# Patient Record
Sex: Male | Born: 1965 | Race: White | Hispanic: Yes | Marital: Married | Smoking: Former smoker
Health system: Southern US, Community
[De-identification: ages and names within clinical notes are randomized; demographics above are authoritative.]

## PROBLEM LIST (undated history)

## (undated) DIAGNOSIS — J45909 Unspecified asthma, uncomplicated: Secondary | ICD-10-CM

## (undated) DIAGNOSIS — F419 Anxiety disorder, unspecified: Secondary | ICD-10-CM

## (undated) DIAGNOSIS — S82851A Displaced trimalleolar fracture of right lower leg, initial encounter for closed fracture: Secondary | ICD-10-CM

## (undated) HISTORY — PX: OTHER SURGICAL HISTORY: SHX169

## (undated) HISTORY — PX: EYE SURGERY: SHX253

---

## 2015-10-13 ENCOUNTER — Emergency Department (HOSPITAL_COMMUNITY): Payer: BLUE CROSS/BLUE SHIELD

## 2015-10-13 ENCOUNTER — Encounter (HOSPITAL_COMMUNITY): Payer: Self-pay | Admitting: Emergency Medicine

## 2015-10-13 ENCOUNTER — Emergency Department (HOSPITAL_COMMUNITY)
Admission: EM | Admit: 2015-10-13 | Discharge: 2015-10-13 | Disposition: A | Discharge status: Home / Self Care | Payer: BLUE CROSS/BLUE SHIELD | Attending: Emergency Medicine | Admitting: Emergency Medicine

## 2015-10-13 DIAGNOSIS — Z87891 Personal history of nicotine dependence: Secondary | ICD-10-CM | POA: Diagnosis not present

## 2015-10-13 DIAGNOSIS — Z9889 Other specified postprocedural states: Secondary | ICD-10-CM | POA: Insufficient documentation

## 2015-10-13 DIAGNOSIS — Y998 Other external cause status: Secondary | ICD-10-CM | POA: Diagnosis not present

## 2015-10-13 DIAGNOSIS — S82851A Displaced trimalleolar fracture of right lower leg, initial encounter for closed fracture: Secondary | ICD-10-CM | POA: Diagnosis not present

## 2015-10-13 DIAGNOSIS — Y9355 Activity, bike riding: Secondary | ICD-10-CM | POA: Diagnosis not present

## 2015-10-13 DIAGNOSIS — R52 Pain, unspecified: Secondary | ICD-10-CM

## 2015-10-13 DIAGNOSIS — Y92410 Unspecified street and highway as the place of occurrence of the external cause: Secondary | ICD-10-CM | POA: Diagnosis not present

## 2015-10-13 DIAGNOSIS — Z79899 Other long term (current) drug therapy: Secondary | ICD-10-CM | POA: Diagnosis not present

## 2015-10-13 DIAGNOSIS — T148XXA Other injury of unspecified body region, initial encounter: Secondary | ICD-10-CM

## 2015-10-13 DIAGNOSIS — S99911A Unspecified injury of right ankle, initial encounter: Secondary | ICD-10-CM | POA: Diagnosis present

## 2015-10-13 LAB — CBC WITH DIFFERENTIAL/PLATELET
BASOS PCT: 0 %
Basophils Absolute: 0 10*3/uL (ref 0.0–0.1)
EOS ABS: 0.1 10*3/uL (ref 0.0–0.7)
Eosinophils Relative: 1 %
HCT: 43.5 % (ref 39.0–52.0)
HEMOGLOBIN: 14.5 g/dL (ref 13.0–17.0)
Lymphocytes Relative: 13 %
Lymphs Abs: 1 10*3/uL (ref 0.7–4.0)
MCH: 30 pg (ref 26.0–34.0)
MCHC: 33.3 g/dL (ref 30.0–36.0)
MCV: 90.1 fL (ref 78.0–100.0)
Monocytes Absolute: 0.5 10*3/uL (ref 0.1–1.0)
Monocytes Relative: 6 %
NEUTROS PCT: 80 %
Neutro Abs: 5.8 10*3/uL (ref 1.7–7.7)
PLATELETS: 176 10*3/uL (ref 150–400)
RBC: 4.83 MIL/uL (ref 4.22–5.81)
RDW: 13.1 % (ref 11.5–15.5)
WBC: 7.3 10*3/uL (ref 4.0–10.5)

## 2015-10-13 LAB — COMPREHENSIVE METABOLIC PANEL
ALBUMIN: 3.9 g/dL (ref 3.5–5.0)
ALK PHOS: 73 U/L (ref 38–126)
ALT: 42 U/L (ref 17–63)
ANION GAP: 4 — AB (ref 5–15)
AST: 38 U/L (ref 15–41)
BUN: 14 mg/dL (ref 6–20)
CHLORIDE: 106 mmol/L (ref 101–111)
CO2: 27 mmol/L (ref 22–32)
Calcium: 8.7 mg/dL — ABNORMAL LOW (ref 8.9–10.3)
Creatinine, Ser: 1.18 mg/dL (ref 0.61–1.24)
GFR calc Af Amer: >60 mL/min (ref >60)
GFR calc non Af Amer: >60 mL/min (ref >60)
GLUCOSE: 109 mg/dL — AB (ref 65–99)
POTASSIUM: 4.6 mmol/L (ref 3.5–5.1)
SODIUM: 137 mmol/L (ref 135–145)
Total Bilirubin: 0.9 mg/dL (ref 0.3–1.2)
Total Protein: 6.1 g/dL — ABNORMAL LOW (ref 6.5–8.1)

## 2015-10-13 LAB — ETHANOL: Alcohol, Ethyl (B): <5 mg/dL (ref <5)

## 2015-10-13 LAB — SAMPLE TO BLOOD BANK

## 2015-10-13 LAB — PROTIME-INR
INR: 1.02 (ref 0.00–1.49)
Prothrombin Time: 13.6 seconds (ref 11.6–15.2)

## 2015-10-13 LAB — CDS SEROLOGY

## 2015-10-13 MED ORDER — HYDROMORPHONE HCL 1 MG/ML IJ SOLN
1.0000 mg | Freq: Once | INTRAMUSCULAR | Status: AC
  Administered 2015-10-13: 1 mg via INTRAVENOUS
  Filled 2015-10-13: qty 1

## 2015-10-13 MED ORDER — KETAMINE HCL 10 MG/ML IJ SOLN
1.0000 mg/kg | Freq: Once | INTRAMUSCULAR | Status: DC
Start: 2015-10-13 — End: 2015-10-13

## 2015-10-13 MED ORDER — OXYCODONE-ACETAMINOPHEN 5-325 MG PO TABS: 2.0000 | ORAL_TABLET | ORAL | Status: DC | PRN

## 2015-10-13 MED ORDER — ONDANSETRON HCL 4 MG/2ML IJ SOLN: 4.0000 mg | Freq: Once | INTRAMUSCULAR | Status: DC

## 2015-10-13 MED ORDER — METOCLOPRAMIDE HCL 5 MG/ML IJ SOLN
10.0000 mg | Freq: Once | INTRAMUSCULAR | Status: AC
  Administered 2015-10-13: 10 mg via INTRAVENOUS
  Filled 2015-10-13: qty 2

## 2015-10-13 MED ORDER — PROMETHAZINE HCL 25 MG/ML IJ SOLN
25.0000 mg | Freq: Once | INTRAMUSCULAR | Status: AC
  Administered 2015-10-13: 25 mg via INTRAVENOUS
  Filled 2015-10-13: qty 1

## 2015-10-13 MED ORDER — PROPOFOL 10 MG/ML IV BOLUS
65.0000 mg | Freq: Once | INTRAVENOUS | Status: AC
  Administered 2015-10-13: 65 mg via INTRAVENOUS

## 2015-10-13 MED ORDER — ONDANSETRON HCL 4 MG/2ML IJ SOLN
4.0000 mg | Freq: Once | INTRAMUSCULAR | Status: AC
  Administered 2015-10-13: 4 mg via INTRAVENOUS
  Filled 2015-10-13: qty 2

## 2015-10-13 MED ORDER — ONDANSETRON HCL 4 MG PO TABS: 4.0000 mg | ORAL_TABLET | Freq: Four times a day (QID) | ORAL | Status: AC

## 2015-10-13 MED ORDER — PROPOFOL 10 MG/ML IV BOLUS
1.0000 mg/kg | Freq: Once | INTRAVENOUS | Status: DC
  Filled 2015-10-13: qty 20

## 2015-10-13 MED ORDER — KETAMINE HCL 10 MG/ML IJ SOLN
65.0000 mg | Freq: Once | INTRAMUSCULAR | Status: AC
  Administered 2015-10-13: 65 mg via INTRAVENOUS

## 2015-10-13 NOTE — ED Notes (Signed)
Patient transported to X-ray 

## 2015-10-13 NOTE — ED Notes (Signed)
Consent for sedation and reduction explained by dr Bebe Shaggywickline and signed by patient.

## 2015-10-13 NOTE — ED Provider Notes (Signed)
Dr Roda ShuttersXu to see patient in the ER   Zadie Rhineonald Manuella Blackson, MD 10/13/15 [phone]

## 2015-10-13 NOTE — ED Notes (Signed)
Walked remaining wasted ketamine  135 mg to the pharmacy.   Kathe BectonLori S Latifa Noble, RN

## 2015-10-13 NOTE — ED Notes (Signed)
MD at bedside. 
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
## 2015-10-13 NOTE — Discharge Instructions (Signed)
Ankle Fracture A fracture is a break in a bone. The ankle joint is made up of three bones. These include the lower (distal)sections of your lower leg bones, called the tibia and fibula, along with a bone in your foot, called the talus. Depending on how bad the break is and if more than one ankle joint bone is broken, a cast or splint is used to protect and keep your injured bone from moving while it heals. Sometimes, surgery is required to help the fracture heal properly.  There are two general types of fractures:  Stable fracture. This includes a single fracture line through one bone, with no injury to ankle ligaments. A fracture of the talus that does not have any displacement (movement of the bone on either side of the fracture line) is also stable.  Unstable fracture. This includes more than one fracture line through one or more bones in the ankle joint. It also includes fractures that have displacement of the bone on either side of the fracture line. CAUSES  A direct blow to the ankle.   Quickly and severely twisting your ankle.  Trauma, such as a car accident or falling from a significant height. RISK FACTORS You may be at a higher risk of ankle fracture if:  You have certain medical conditions.  You are involved in high-impact sports.  You are involved in a high-impact car accident. SIGNS AND SYMPTOMS   Tender and swollen ankle.  Bruising around the injured ankle.  Pain on movement of the ankle.  Difficulty walking or putting weight on the ankle.  A cold foot below the site of the ankle injury. This can occur if the blood vessels passing through your injured ankle were also damaged.  Numbness in the foot below the site of the ankle injury. DIAGNOSIS  An ankle fracture is usually diagnosed with a physical exam and X-rays. A CT scan may also be required for complex fractures. TREATMENT  Stable fractures are treated with a cast or splint and using crutches to avoid putting  weight on your injured ankle. This is followed by an ankle strengthening program. Some patients require a special type of cast, depending on other medical problems they may have. Unstable fractures require surgery to ensure the bones heal properly. Your health care provider will tell you what type of fracture you have and the best treatment for your condition. HOME CARE INSTRUCTIONS   Review correct crutch use with your health care provider and use your crutches as directed. Safe use of crutches is extremely important. Misuse of crutches can cause you to fall or cause injury to nerves in your hands or armpits.  Do not put weight or pressure on the injured ankle until directed by your health care provider.  To lessen the swelling, keep the injured leg elevated while sitting or lying down.  Apply ice to the injured area:  Put ice in a plastic bag.  Place a towel between your cast and the bag.  Leave the ice on for 20 minutes, 2-3 times a day.  If you have a plaster or fiberglass cast:  Do not try to scratch the skin under the cast with any objects. This can increase your risk of skin infection.  Check the skin around the cast every day. You may put lotion on any red or sore areas.  Keep your cast dry and clean.  If you have a plaster splint:  Wear the splint as directed.  You may loosen the elastic   around the splint if your toes become numb, tingle, or turn cold or blue.  Do not put pressure on any part of your cast or splint; it may break. Rest your cast only on a pillow the first 24 hours until it is fully hardened.  Your cast or splint can be protected during bathing with a plastic bag sealed to your skin with medical tape. Do not lower the cast or splint into water.  Take medicines as directed by your health care provider. Only take over-the-counter or prescription medicines for pain, discomfort, or fever as directed by your health care provider.  Do not drive a vehicle until  your health care provider specifically tells you it is safe to do so.  If your health care provider has given you a follow-up appointment, it is very important to keep that appointment. Not keeping the appointment could result in a chronic or permanent injury, pain, and disability. If you have any problem keeping the appointment, call the facility for assistance. SEEK MEDICAL CARE IF: You develop increased swelling or discomfort. SEEK IMMEDIATE MEDICAL CARE IF:   Your cast gets damaged or breaks.  You have continued severe pain.  You develop new pain or swelling after the cast was put on.  Your skin or toenails below the injury turn blue or gray.  Your skin or toenails below the injury feel cold, numb, or have loss of sensitivity to touch.  There is a bad smell or pus draining from under the cast. MAKE SURE YOU:   Understand these instructions.  Will watch your condition.  Will get help right away if you are not doing well or get worse.   This information is not intended to replace advice given to you by your health care provider. Make sure you discuss any questions you have with your health care provider.   Document Released: 11/21/2000 Document Revised: 11/29/2013 Document Reviewed: 06/23/2013 Elsevier Interactive Patient Education 2016 [employer]  

## 2015-10-13 NOTE — Sedation Documentation (Signed)
Medication dose calculated and verified for sedation by this RN and Dr. Gavin PoundJustin Brooten at the bedside

## 2015-10-13 NOTE — ED Provider Notes (Signed)
SPLINT APPLICATION Date/Time: 5:55 PM Authorized by: Joya GaskinsWICKLINE,Roxanna Mcever W Consent: Verbal consent obtained. Risks and benefits: risks, benefits and alternatives were discussed Consent given by: patient Splint applied by: orthopedic technician Location details: right ankle Splint type: posterior/stirrup splint Supplies used: fiberglass Post-procedure: The splinted body part was neurovascularly unchanged following the procedure. Patient tolerance: Patient tolerated the procedure well with no immediate complications.     Don Rhineonald Micahel Omlor, MD 10/13/15 [phone]
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
## 2015-10-13 NOTE — ED Provider Notes (Signed)
Assumed care from dr Littie Deedsgentry Plan to reduce RIGHT ankle fracture/dislocation then call back Dr Roda ShuttersXu Pt agreeable with plan for sedation   Don Rhineonald Kinley Ferrentino, MD 10/13/15 [phone]

## 2015-10-13 NOTE — ED Provider Notes (Signed)
Procedural sedation Performed by: Joya GaskinsWICKLINE,Yahsir Wickens W Consent: Verbal consent obtained. Written consent obtain Risks and benefits: risks, benefits and alternatives were discussed Required items: required  devices, and special equipment available Patient identity confirmed: arm band and provided demographic data Time out: Immediately prior to procedure a "time out" was called to verify the correct patient, procedure, equipment, support staff and site/side marked as required. Sedation type: moderate (conscious) sedation NPO time confirmed and considered Sedatives: PROPOFOL Physician Time at Bedside: 16 Vitals: Vital signs were monitored during sedation. Cardiac Monitor, pulse oximeter Patient tolerance: Patient tolerated the procedure well with no immediate complications. Comments: Pt with uneventful recovery. Returned to pre-procedural sedation baseline also utilized ketamine in addition  Reduction of dislocation Date/Time: 5:32 PM Performed by: Joya GaskinsWICKLINE,Keynan Heffern W Authorized by: Joya GaskinsWICKLINE,Venice Marcucci W Consent: Verbal consent obtained. Written consent obtain Risks and benefits: risks, benefits and alternatives were discussed Consent given by: patient Required items: required devices, and special equipment available Time out: Immediately prior to procedure a "time out" was called to verify the correct patient, procedure, equipment, support staff and site/side marked as required.  Patient sedated: propofol/ketamine  Vitals: Vital signs were monitored during sedation. Patient tolerance: Patient tolerated the procedure well with no immediate complications. Joint: right ankle Reduction technique: traction/countertraction      Zadie Rhineonald Emma Birchler, MD 10/13/15 1734

## 2015-10-13 NOTE — ED Notes (Signed)
135 mg remaining from 200mg  bottle of propofol wasted with Cher NakaiLori Berdik, RN.

## 2015-10-13 NOTE — Progress Notes (Signed)
Orthopedic Tech Progress Note Patient Details:  Don Cooke December 22, 1965 [id number]  Ortho Devices Type of Ortho Device: Ace wrap, Crutches, Post (short leg) splint, Stirrup splint Ortho Device/Splint Interventions: Application   Saul FordyceJennifer C Averie Hornbaker 10/13/2015, 5:14 PM

## 2015-10-15 ENCOUNTER — Other Ambulatory Visit (HOSPITAL_BASED_OUTPATIENT_CLINIC_OR_DEPARTMENT_OTHER): Payer: Self-pay | Admitting: Orthopaedic Surgery

## 2015-10-15 ENCOUNTER — Encounter (HOSPITAL_BASED_OUTPATIENT_CLINIC_OR_DEPARTMENT_OTHER): Payer: Self-pay | Admitting: *Deleted

## 2015-10-17 ENCOUNTER — Encounter (HOSPITAL_BASED_OUTPATIENT_CLINIC_OR_DEPARTMENT_OTHER)
Admission: RE | Disposition: A | Discharge status: Home / Self Care | Payer: Self-pay | Source: Ambulatory Visit | Attending: Orthopaedic Surgery

## 2015-10-17 ENCOUNTER — Ambulatory Visit (HOSPITAL_BASED_OUTPATIENT_CLINIC_OR_DEPARTMENT_OTHER): Payer: BLUE CROSS/BLUE SHIELD | Admitting: Certified Registered"

## 2015-10-17 ENCOUNTER — Ambulatory Visit (HOSPITAL_COMMUNITY): Payer: BLUE CROSS/BLUE SHIELD

## 2015-10-17 ENCOUNTER — Ambulatory Visit (HOSPITAL_BASED_OUTPATIENT_CLINIC_OR_DEPARTMENT_OTHER)
Admission: RE | Admit: 2015-10-17 | Discharge: 2015-10-17 | Disposition: A | Discharge status: Home / Self Care | Payer: BLUE CROSS/BLUE SHIELD | Source: Ambulatory Visit | Attending: Orthopaedic Surgery | Admitting: Orthopaedic Surgery

## 2015-10-17 ENCOUNTER — Encounter (HOSPITAL_BASED_OUTPATIENT_CLINIC_OR_DEPARTMENT_OTHER): Payer: Self-pay | Admitting: Certified Registered"

## 2015-10-17 DIAGNOSIS — Y9289 Other specified places as the place of occurrence of the external cause: Secondary | ICD-10-CM | POA: Insufficient documentation

## 2015-10-17 DIAGNOSIS — S82851A Displaced trimalleolar fracture of right lower leg, initial encounter for closed fracture: Secondary | ICD-10-CM | POA: Insufficient documentation

## 2015-10-17 DIAGNOSIS — J45909 Unspecified asthma, uncomplicated: Secondary | ICD-10-CM | POA: Diagnosis not present

## 2015-10-17 DIAGNOSIS — Y998 Other external cause status: Secondary | ICD-10-CM | POA: Diagnosis not present

## 2015-10-17 DIAGNOSIS — X58XXXA Exposure to other specified factors, initial encounter: Secondary | ICD-10-CM | POA: Insufficient documentation

## 2015-10-17 DIAGNOSIS — Y9389 Activity, other specified: Secondary | ICD-10-CM | POA: Insufficient documentation

## 2015-10-17 DIAGNOSIS — S82891A Other fracture of right lower leg, initial encounter for closed fracture: Secondary | ICD-10-CM

## 2015-10-17 DIAGNOSIS — F419 Anxiety disorder, unspecified: Secondary | ICD-10-CM | POA: Diagnosis not present

## 2015-10-17 DIAGNOSIS — Z87891 Personal history of nicotine dependence: Secondary | ICD-10-CM | POA: Insufficient documentation

## 2015-10-17 HISTORY — DX: Displaced trimalleolar fracture of right lower leg, initial encounter for closed fracture: S82.851A

## 2015-10-17 HISTORY — DX: Unspecified asthma, uncomplicated: J45.909

## 2015-10-17 HISTORY — PX: ORIF ANKLE FRACTURE: SHX5408

## 2015-10-17 HISTORY — DX: Anxiety disorder, unspecified: F41.9

## 2015-10-17 SURGERY — OPEN REDUCTION INTERNAL FIXATION (ORIF) ANKLE FRACTURE
Anesthesia: Regional | Site: Ankle | Laterality: Right

## 2015-10-17 MED ORDER — DEXAMETHASONE SODIUM PHOSPHATE 10 MG/ML IJ SOLN
INTRAMUSCULAR | Status: AC
  Filled 2015-10-17: qty 1

## 2015-10-17 MED ORDER — MIDAZOLAM HCL 2 MG/2ML IJ SOLN
INTRAMUSCULAR | Status: AC
  Filled 2015-10-17: qty 4

## 2015-10-17 MED ORDER — FENTANYL CITRATE (PF) 100 MCG/2ML IJ SOLN
50.0000 ug | INTRAMUSCULAR | Status: DC | PRN
  Administered 2015-10-17: 100 ug via INTRAVENOUS

## 2015-10-17 MED ORDER — SCOPOLAMINE 1 MG/3DAYS TD PT72
1.0000 | MEDICATED_PATCH | Freq: Once | TRANSDERMAL | Status: AC | PRN
  Administered 2015-10-17: 1 via TRANSDERMAL

## 2015-10-17 MED ORDER — ONDANSETRON HCL 4 MG/2ML IJ SOLN: 4.0000 mg | Freq: Four times a day (QID) | INTRAMUSCULAR | Status: DC | PRN

## 2015-10-17 MED ORDER — DEXAMETHASONE SODIUM PHOSPHATE 10 MG/ML IJ SOLN
INTRAMUSCULAR | Status: DC | PRN
  Administered 2015-10-17: 10 mg via INTRAVENOUS

## 2015-10-17 MED ORDER — PROPOFOL 500 MG/50ML IV EMUL
INTRAVENOUS | Status: AC
  Filled 2015-10-17: qty 50

## 2015-10-17 MED ORDER — MIDAZOLAM HCL 2 MG/2ML IJ SOLN
1.0000 mg | INTRAMUSCULAR | Status: DC | PRN
  Administered 2015-10-17 (×2): 2 mg via INTRAVENOUS

## 2015-10-17 MED ORDER — HYDROMORPHONE HCL 1 MG/ML IJ SOLN: 0.2500 mg | INTRAMUSCULAR | Status: DC | PRN

## 2015-10-17 MED ORDER — ONDANSETRON HCL 4 MG/2ML IJ SOLN
INTRAMUSCULAR | Status: DC | PRN
  Administered 2015-10-17: 4 mg via INTRAVENOUS

## 2015-10-17 MED ORDER — LIDOCAINE HCL (CARDIAC) 20 MG/ML IV SOLN
INTRAVENOUS | Status: AC
  Filled 2015-10-17: qty 5

## 2015-10-17 MED ORDER — PROPOFOL 10 MG/ML IV BOLUS
INTRAVENOUS | Status: DC | PRN
  Administered 2015-10-17: 200 mg via INTRAVENOUS

## 2015-10-17 MED ORDER — ASPIRIN EC 325 MG PO TBEC: 325.0000 mg | DELAYED_RELEASE_TABLET | Freq: Two times a day (BID) | ORAL | Status: AC

## 2015-10-17 MED ORDER — BUPIVACAINE-EPINEPHRINE (PF) 0.5% -1:200000 IJ SOLN
INTRAMUSCULAR | Status: DC | PRN
  Administered 2015-10-17 (×2): 20 mL via PERINEURAL

## 2015-10-17 MED ORDER — GLYCOPYRROLATE 0.2 MG/ML IJ SOLN: 0.2000 mg | Freq: Once | INTRAMUSCULAR | Status: DC | PRN

## 2015-10-17 MED ORDER — LACTATED RINGERS IV SOLN
INTRAVENOUS | Status: DC
  Administered 2015-10-17 (×2): via INTRAVENOUS

## 2015-10-17 MED ORDER — MIDAZOLAM HCL 2 MG/2ML IJ SOLN
INTRAMUSCULAR | Status: AC
  Filled 2015-10-17: qty 2

## 2015-10-17 MED ORDER — LIDOCAINE HCL (CARDIAC) 20 MG/ML IV SOLN
INTRAVENOUS | Status: DC | PRN
  Administered 2015-10-17: 30 mg via INTRAVENOUS

## 2015-10-17 MED ORDER — OXYCODONE HCL ER 10 MG PO T12A: 10.0000 mg | EXTENDED_RELEASE_TABLET | Freq: Two times a day (BID) | ORAL | Status: AC

## 2015-10-17 MED ORDER — ONDANSETRON HCL 4 MG/2ML IJ SOLN
INTRAMUSCULAR | Status: AC
  Filled 2015-10-17: qty 2

## 2015-10-17 MED ORDER — CEFAZOLIN SODIUM-DEXTROSE 2-3 GM-% IV SOLR
2.0000 g | INTRAVENOUS | Status: AC
  Administered 2015-10-17: 2 g via INTRAVENOUS

## 2015-10-17 MED ORDER — OXYCODONE-ACETAMINOPHEN 5-325 MG PO TABS: 1.0000 | ORAL_TABLET | ORAL | Status: AC | PRN

## 2015-10-17 MED ORDER — FENTANYL CITRATE (PF) 100 MCG/2ML IJ SOLN
INTRAMUSCULAR | Status: AC
  Filled 2015-10-17: qty 2

## 2015-10-17 MED ORDER — CEFAZOLIN SODIUM-DEXTROSE 2-3 GM-% IV SOLR
INTRAVENOUS | Status: AC
  Filled 2015-10-17: qty 50

## 2015-10-17 MED ORDER — OXYCODONE HCL 5 MG PO TABS: 5.0000 mg | ORAL_TABLET | Freq: Once | ORAL | Status: DC | PRN

## 2015-10-17 MED ORDER — SCOPOLAMINE 1 MG/3DAYS TD PT72
MEDICATED_PATCH | TRANSDERMAL | Status: AC
  Filled 2015-10-17: qty 1

## 2015-10-17 MED ORDER — OXYCODONE HCL 5 MG/5ML PO SOLN: 5.0000 mg | Freq: Once | ORAL | Status: DC | PRN

## 2015-10-17 SURGICAL SUPPLY — 85 items
4.0 x 36mm cannulated screw ×3 IMPLANT
4.0mm x 36mm TL12mm cannulated screw ×3 IMPLANT
BANDAGE ELASTIC 4 VELCRO ST LF (GAUZE/BANDAGES/DRESSINGS) IMPLANT
BANDAGE ELASTIC 6 VELCRO ST LF (GAUZE/BANDAGES/DRESSINGS) ×3 IMPLANT
BANDAGE ESMARK 6X9 LF (GAUZE/BANDAGES/DRESSINGS) ×1 IMPLANT
BIT DRILL CANN 2.7 (BIT) ×1
BIT DRILL CANN 2.7MM (BIT) ×1
BIT DRILL SRG 2.7XCANN AO CPLG (BIT) ×1 IMPLANT
BIT DRL SRG 2.7XCANN AO CPLNG (BIT) ×1
BLADE HEX COATED 2.75 (ELECTRODE) ×3 IMPLANT
BLADE SURG 15 STRL LF DISP TIS (BLADE) ×2 IMPLANT
BLADE SURG 15 STRL SS (BLADE) ×4
BNDG COHESIVE 6X5 TAN STRL LF (GAUZE/BANDAGES/DRESSINGS) ×3 IMPLANT
BNDG ESMARK 6X9 LF (GAUZE/BANDAGES/DRESSINGS) ×3
CANISTER SUCT 1200ML W/VALVE (MISCELLANEOUS) ×3 IMPLANT
COVER BACK TABLE 60X90IN (DRAPES) ×3 IMPLANT
CUFF TOURNIQUET SINGLE 24IN (TOURNIQUET CUFF) IMPLANT
CUFF TOURNIQUET SINGLE 34IN LL (TOURNIQUET CUFF) ×3 IMPLANT
DECANTER SPIKE VIAL GLASS SM (MISCELLANEOUS) IMPLANT
DRAPE C-ARM 42X72 X-RAY (DRAPES) ×3 IMPLANT
DRAPE C-ARMOR (DRAPES) ×3 IMPLANT
DRAPE EXTREMITY T 121X128X90 (DRAPE) ×3 IMPLANT
DRAPE SURG 17X23 STRL (DRAPES) ×6 IMPLANT
DRAPE U 20/CS (DRAPES) ×3 IMPLANT
DRAPE U-SHAPE 47X51 STRL (DRAPES) IMPLANT
DRILL 2.6X122MM WL AO SHAFT (BIT) ×3 IMPLANT
DRSG PAD ABDOMINAL 8X10 ST (GAUZE/BANDAGES/DRESSINGS) ×6 IMPLANT
DURAPREP 26ML APPLICATOR (WOUND CARE) ×3 IMPLANT
ELECT REM PT RETURN 9FT ADLT (ELECTROSURGICAL) ×3
ELECTRODE REM PT RTRN 9FT ADLT (ELECTROSURGICAL) ×1 IMPLANT
GAUZE SPONGE 4X4 12PLY STRL (GAUZE/BANDAGES/DRESSINGS) ×3 IMPLANT
GAUZE SPONGE 4X4 16PLY XRAY LF (GAUZE/BANDAGES/DRESSINGS) IMPLANT
GAUZE XEROFORM 1X8 LF (GAUZE/BANDAGES/DRESSINGS) ×3 IMPLANT
GLOVE BIOGEL PI IND STRL 7.0 (GLOVE) ×2 IMPLANT
GLOVE BIOGEL PI INDICATOR 7.0 (GLOVE) ×4
GLOVE ECLIPSE 6.5 STRL STRAW (GLOVE) ×3 IMPLANT
GLOVE NEODERM STRL 7.5 LF PF (GLOVE) ×1 IMPLANT
GLOVE SURG NEODERM 7.5  LF PF (GLOVE) ×2
GLOVE SURG SYN 7.5  E (GLOVE) ×2
GLOVE SURG SYN 7.5 E (GLOVE) ×1 IMPLANT
GOWN STRL REIN XL XLG (GOWN DISPOSABLE) ×3 IMPLANT
GOWN STRL REUS W/ TWL LRG LVL3 (GOWN DISPOSABLE) ×1 IMPLANT
GOWN STRL REUS W/TWL LRG LVL3 (GOWN DISPOSABLE) ×2
K-WIRE ORTHOPEDIC 1.4X150L (WIRE) ×9
KWIRE ORTHOPEDIC 1.4X150L (WIRE) ×3 IMPLANT
NEEDLE HYPO 22GX1.5 SAFETY (NEEDLE) IMPLANT
NS IRRIG 1000ML POUR BTL (IV SOLUTION) ×3 IMPLANT
PACK BASIN DAY SURGERY FS (CUSTOM PROCEDURE TRAY) ×3 IMPLANT
PAD CAST 3X4 CTTN HI CHSV (CAST SUPPLIES) IMPLANT
PAD CAST 4YDX4 CTTN HI CHSV (CAST SUPPLIES) ×1 IMPLANT
PADDING CAST COTTON 3X4 STRL (CAST SUPPLIES)
PADDING CAST COTTON 4X4 STRL (CAST SUPPLIES) ×2
PADDING CAST COTTON 6X4 STRL (CAST SUPPLIES) IMPLANT
PADDING CAST SYN 6 (CAST SUPPLIES)
PADDING CAST SYNTHETIC 4 (CAST SUPPLIES)
PADDING CAST SYNTHETIC 4X4 STR (CAST SUPPLIES) IMPLANT
PADDING CAST SYNTHETIC 6X4 NS (CAST SUPPLIES) IMPLANT
PENCIL BUTTON HOLSTER BLD 10FT (ELECTRODE) ×3 IMPLANT
PLATE 7H 96MM (Plate) ×3 IMPLANT
SCREW BONE 14MMX3.5MM (Screw) ×3 IMPLANT
SCREW BONE 3.5X16MM (Screw) ×12 IMPLANT
SCREW BONE NON-LCKING 3.5X12MM (Screw) ×6 IMPLANT
SLEEVE SCD COMPRESS KNEE MED (MISCELLANEOUS) ×3 IMPLANT
SPLINT FAST PLASTER 5X30 (CAST SUPPLIES)
SPLINT FIBERGLASS 3X35 (CAST SUPPLIES) IMPLANT
SPLINT FIBERGLASS 4X30 (CAST SUPPLIES) ×3 IMPLANT
SPLINT PLASTER CAST FAST 5X30 (CAST SUPPLIES) IMPLANT
SPONGE LAP 18X18 X RAY DECT (DISPOSABLE) IMPLANT
SPONGE LAP 4X18 X RAY DECT (DISPOSABLE) ×3 IMPLANT
STAPLER VISISTAT (STAPLE) IMPLANT
SUCTION FRAZIER TIP 10 FR DISP (SUCTIONS) ×3 IMPLANT
SUT ETHILON 3 0 PS 1 (SUTURE) ×6 IMPLANT
SUT VIC AB 0 CT1 18XCR BRD 8 (SUTURE) ×1 IMPLANT
SUT VIC AB 0 CT1 8-18 (SUTURE) ×2
SUT VIC AB 2-0 CT1 27 (SUTURE)
SUT VIC AB 2-0 CT1 TAPERPNT 27 (SUTURE) IMPLANT
SUT VIC AB 2-0 SH 27 (SUTURE) ×2
SUT VIC AB 2-0 SH 27XBRD (SUTURE) ×1 IMPLANT
SYR BULB 3OZ (MISCELLANEOUS) ×3 IMPLANT
SYR CONTROL 10ML LL (SYRINGE) IMPLANT
TOWEL OR 17X24 6PK STRL BLUE (TOWEL DISPOSABLE) ×3 IMPLANT
TUBE CONNECTING 20'X1/4 (TUBING) ×1
TUBE CONNECTING 20X1/4 (TUBING) ×2 IMPLANT
UNDERPAD 30X30 (UNDERPADS AND DIAPERS) ×3 IMPLANT
YANKAUER SUCT BULB TIP NO VENT (SUCTIONS) ×3 IMPLANT

## 2015-10-17 NOTE — Anesthesia Preprocedure Evaluation (Addendum)
Anesthesia Evaluation  Patient identified by MRN, date of birth, ID band Patient awake    Reviewed: Allergy & Precautions, NPO status , Patient's Chart, lab work & pertinent test results  Airway Mallampati: II   Neck ROM: full    Dental   Pulmonary asthma , former smoker,    breath sounds clear to auscultation       Cardiovascular negative cardio ROS   Rhythm:regular Rate:Normal     Neuro/Psych PSYCHIATRIC DISORDERS Anxiety    GI/Hepatic   Endo/Other    Renal/GU      Musculoskeletal   Abdominal   Peds  Hematology   Anesthesia Other Findings   Reproductive/Obstetrics                            Anesthesia Physical Anesthesia Plan  ASA: II  Anesthesia Plan: General and Regional   Post-op Pain Management: MAC Combined w/ Regional for Post-op pain   Induction: Intravenous  Airway Management Planned: LMA  Additional Equipment:   Intra-op Plan:   Post-operative Plan:   Informed Consent: I have reviewed the patients History and Physical, chart, labs and discussed the procedure including the risks, benefits and alternatives for the proposed anesthesia with the patient or authorized representative who has indicated his/her understanding and acceptance.     Plan Discussed with: CRNA, Anesthesiologist and Surgeon  Anesthesia Plan Comments:         Anesthesia Quick Evaluation

## 2015-10-17 NOTE — Discharge Instructions (Signed)
° ° °  1. Keep splint clean and dry °2. Elevate foot above level of the heart °3. Take aspirin to prevent blood clots °4. Take pain meds as needed °5. Strict non weight bearing to operative extremity ° ° ° ° °Regional Anesthesia Blocks ° °1. Numbness or the inability to move the "blocked" extremity may last from 3-48 hours after placement. The length of time depends on the medication injected and your individual response to the medication. If the numbness is not going away after 48 hours, call your surgeon. ° °2. The extremity that is blocked will need to be protected until the numbness is gone and the  Strength has returned. Because you cannot feel it, you will need to take extra care to avoid injury. Because it may be weak, you may have difficulty moving it or using it. You may not know what position it is in without looking at it while the block is in effect. ° °3. For blocks in the legs and feet, returning to weight bearing and walking needs to be done carefully. You will need to wait until the numbness is entirely gone and the strength has returned. You should be able to move your leg and foot normally before you try and bear weight or walk. You will need someone to be with you when you first try to ensure you do not fall and possibly risk injury. ° °4. Bruising and tenderness at the needle site are common side effects and will resolve in a few days. ° °5. Persistent numbness or new problems with movement should be communicated to the surgeon or the [facility] ([phone])/ [facility] ([phone]). ° ° ° ° ° °Post Anesthesia Home Care Instructions ° °Activity: °Get plenty of rest for the remainder of the day. A responsible adult should stay with you for 24 hours following the procedure.  °For the next 24 hours, DO NOT: °-Drive a car °-Operate machinery °-Drink alcoholic beverages °-Take any medication unless instructed by your physician °-Make any legal decisions or sign important  papers. ° °Meals: °Start with liquid foods such as gelatin or soup. Progress to regular foods as tolerated. Avoid greasy, spicy, heavy foods. If nausea and/or vomiting occur, drink only clear liquids until the nausea and/or vomiting subsides. Call your physician if vomiting continues. ° °Special Instructions/Symptoms: °Your throat may feel dry or sore from the anesthesia or the breathing tube placed in your throat during surgery. If this causes discomfort, gargle with warm salt water. The discomfort should disappear within 24 hours. ° °If you had a scopolamine patch placed behind your ear for the management of post- operative nausea and/or vomiting: ° °1. The medication in the patch is effective for 72 hours, after which it should be removed.  Wrap patch in a tissue and discard in the trash. Wash hands thoroughly with soap and water. °2. You may remove the patch earlier than 72 hours if you experience unpleasant side effects which may include dry mouth, dizziness or visual disturbances. °3. Avoid touching the patch. Wash your hands with soap and water after contact with the patch. °  ° °

## 2015-10-17 NOTE — Transfer of Care (Signed)
Immediate Anesthesia Transfer of Care Note  Patient: Don Cooke  Procedure(s) Performed: Procedure(s): OPEN REDUCTION INTERNAL FIXATION (ORIF) RIGHT ANKLE FRACTURE (Right)  Patient Location: PACU  Anesthesia Type:GA combined with regional for post-op pain  Level of Consciousness: awake, alert , oriented and patient cooperative  Airway & Oxygen Therapy: Patient Spontanous Breathing and Patient connected to face mask oxygen  Post-op Assessment: Report given to RN and Post -op Vital signs reviewed and stable  Post vital signs: Reviewed and stable  Last Vitals:  Filed Vitals:   10/17/15 1105  BP:   Pulse: 80  Temp:   Resp: 16    Complications: No apparent anesthesia complications

## 2015-10-17 NOTE — Anesthesia Postprocedure Evaluation (Signed)
Anesthesia Post Note  Patient: Don Cooke  Procedure(s) Performed: Procedure(s) (LRB): OPEN REDUCTION INTERNAL FIXATION (ORIF) RIGHT ANKLE FRACTURE (Right)  Anesthesia type: General  Patient location: PACU  Post pain: Pain level controlled and Adequate analgesia  Post assessment: Post-op Vital signs reviewed, Patient's Cardiovascular Status Stable, Respiratory Function Stable, Patent Airway and Pain level controlled  Last Vitals:  Filed Vitals:   10/17/15 1200  BP: 131/81  Pulse: 77  Temp:   Resp: 16    Post vital signs: Reviewed and stable  Level of consciousness: awake, alert  and oriented  Complications: No apparent anesthesia complications

## 2015-10-17 NOTE — Anesthesia Procedure Notes (Addendum)
Anesthesia Regional Block:  Popliteal block  Pre-Anesthetic Checklist: ,, timeout performed, Correct Patient, Correct Site, Correct Laterality, Correct Procedure, Correct Position, site marked, Risks and benefits discussed,  Surgical consent,  Pre-op evaluation,  At surgeon's request and post-op pain management  Laterality: Right  Prep: chloraprep       Needles:  Injection technique: Single-shot  Needle Type: Echogenic Stimulator Needle     Needle Length: 9cm 9 cm Needle Gauge: 21 and 21 G    Additional Needles:  Procedures: ultrasound guided (picture in chart) and nerve stimulator Popliteal block  Nerve Stimulator or Paresthesia:  Response: plantar flexion of foot, 0.45 mA,   Additional Responses:   Narrative:  Start time: 10/17/2015 8:50 AM End time: 10/17/2015 9:01 AM Injection made incrementally with aspirations every 5 mL.  Performed by: Personally  Anesthesiologist: HODIERNE, ADAM  Additional Notes: Functioning IV was confirmed and monitors were applied.  A 90mm 21ga Arrow echogenic stimulator needle was used. Sterile prep and drape,hand hygiene and sterile gloves were used.  Negative aspiration and negative test dose prior to incremental administration of local anesthetic (20mL of 0.5% bupivacaine +epi). The patient tolerated the procedure well.  Ultrasound guidance: relevent anatomy identified, needle position confirmed, local anesthetic spread visualized around nerve(s), vascular puncture avoided.  Image printed for medical record.   Adductor Canal block also done.  20mL of 0.5% bupivacaine +epi given at this site.  Pt tolerated the procedure well.   Procedure Name: LMA Insertion Date/Time: 10/17/2015 9:26 AM Performed by: Shadoe Bethel D Pre-anesthesia Checklist: Patient identified, Emergency Drugs available, Suction available and Patient being monitored Patient Re-evaluated:Patient Re-evaluated prior to inductionOxygen Delivery Method: Circle System  Utilized Preoxygenation: Pre-oxygenation with 100% oxygen Intubation Type: IV induction Ventilation: Mask ventilation without difficulty LMA: LMA inserted LMA Size: 4.0 Number of attempts: 1 Airway Equipment and Method: Bite block Placement Confirmation: positive ETCO2 Tube secured with: Tape Dental Injury: Teeth and Oropharynx as per pre-operative assessment

## 2015-10-17 NOTE — Progress Notes (Signed)
Assisted Dr. Hodierne with right, ultrasound guided, popliteal/saphenous block. Side rails up, monitors on throughout procedure. See vital signs in flow sheet. Tolerated Procedure well. 

## 2015-10-17 NOTE — H&P (Signed)
    PREOPERATIVE H&P  Chief Complaint: right trimalleolar ankle fracture  HPI: Loletha CarrowRandy Mungia is a 49 y.o. male who presents for surgical treatment of right trimalleolar ankle fracture.  He denies any changes in medical history.  Past Medical History  Diagnosis Date  . Asthma   . Anxiety   . Trimalleolar fracture of right ankle    Past Surgical History  Procedure Laterality Date  . Rt eye    . Left leg surgery    . Eye surgery     Social History   Social History  . Marital Status: Married    Spouse Name: N/A  . Number of Children: N/A  . Years of Education: N/A   Social History Main Topics  . Smoking status: Former Games developermoker  . Smokeless tobacco: None  . Alcohol Use: Yes     Comment: social  . Drug Use: No  . Sexual Activity: Not Asked   Other Topics Concern  . None   Social History Narrative   History reviewed. No pertinent family history. No Known Allergies Prior to Admission medications   Medication Sig Start Date End Date Taking? Authorizing Provider  ALPRAZolam Prudy Feeler(XANAX) 0.5 MG tablet Take 0.5 mg by mouth 3 (three) times daily as needed for anxiety.    Yes Historical Provider, MD  escitalopram (LEXAPRO) 20 MG tablet Take 20 mg by mouth daily.   Yes Historical Provider, MD  oxyCODONE-acetaminophen (PERCOCET/ROXICET) 5-325 MG tablet Take 2 tablets by mouth every 4 (four) hours as needed for severe pain. 10/13/15  Yes Gavin PoundJustin Brooten, MD  albuterol (PROVENTIL HFA;VENTOLIN HFA) 108 (90 BASE) MCG/ACT inhaler Inhale into the lungs every 6 (six) hours as needed for wheezing or shortness of breath.    Historical Provider, MD  ondansetron (ZOFRAN) 4 MG tablet Take 1 tablet (4 mg total) by mouth every 6 (six) hours. 10/13/15   Gavin PoundJustin Brooten, MD     Positive ROS: All other systems have been reviewed and were otherwise negative with the exception of those mentioned in the HPI and as above.  Physical Exam: General: Alert, no acute distress Cardiovascular: No pedal  edema Respiratory: No cyanosis, no use of accessory musculature GI: abdomen soft Skin: No lesions in the area of chief complaint Neurologic: Sensation intact distally Psychiatric: Patient is competent for consent with normal mood and affect Lymphatic: no lymphedema  MUSCULOSKELETAL: exam stable  Assessment: right trimalleolar ankle fracture  Plan: Plan for Procedure(s): OPEN REDUCTION INTERNAL FIXATION (ORIF) RIGHT ANKLE FRACTURE  The risks benefits and alternatives were discussed with the patient including but not limited to the risks of nonoperative treatment, versus surgical intervention including infection, bleeding, nerve injury,  blood clots, cardiopulmonary complications, morbidity, mortality, among others, and they were willing to proceed.   Cheral AlmasXu, Naiping Michael, MD   10/17/2015 7:28 AM

## 2015-10-17 NOTE — Op Note (Signed)
   Date of Surgery: 10/17/2015  INDICATIONS: [name] is a 49 y.o.-year-old male who sustained a right ankle fracture; he was indicated for open reduction and internal fixation due to the displaced nature of the articular fracture and came to the operating room today for this procedure. The patient did consent to the procedure after discussion of the risks and benefits.  PREOPERATIVE DIAGNOSIS: right trimalleolar ankle fracture  POSTOPERATIVE DIAGNOSIS: Same.  PROCEDURE: Open treatment of right ankle fracture with internal fixation.  Trimalleolar w/o fixation of posterior malleolus CPT 27822.  SURGEON: N. Glee ArvinMichael Shanese Riemenschneider, M.D.  ASSIST: none.  ANESTHESIA:  general, regional  TOURNIQUET TIME: less than 2 hrs  IV FLUIDS AND URINE: See anesthesia.  ESTIMATED BLOOD LOSS: minimal mL.  IMPLANTS: [employer] Variax 7 hole semitubular plate  COMPLICATIONS: None.  DESCRIPTION OF PROCEDURE: The patient was brought to the operating room and placed supine on the operating table.  The patient had been signed prior to the procedure and this was documented. The patient had the anesthesia placed by the anesthesiologist.  A nonsterile tourniquet was placed on the upper thigh.  The prep verification and incision time-outs were performed to confirm that this was the correct patient, site, side and location. The patient had an SCD on the opposite lower extremity. The patient did receive antibiotics prior to the incision and was re-dosed during the procedure as needed at indicated intervals.  The patient had the lower extremity prepped and draped in the standard surgical fashion.  The extremity was exsanguinated using an esmarch bandage and the tourniquet was inflated to 300 mm Hg.  A lateral incision was created. Full-thickness flaps were elevated off of the lateral fibula. The fracture was exposed. Organized hematoma and periosteum was removed. The fracture was reduced. A 7-hole semitubular plate was placed at the  appropriate position. Fluoroscopy was used to confirm placement. Nonlocking screws were placed through the plate. We were able to place 1 lag screw through the plate across the fracture. We then turned our attention to the medial malleolus fracture. A longitudinal curvilinear incision was utilized. Full-thickness flaps were created. Saphenous neurovascular bundle was identified and protected. The fracture was exposed. The entrapped periosteum was removed from the fracture. The ankle joint was not injured. The fracture was reduced. 2 parallel K wires were advanced across the fracture. We then placed 2 cannulated screws over the K wires to hold the fracture in place. Final x-rays were taken. Wounds were thoroughly irrigated. Incisions were closed in layer fashion using 0 Vicryl, 2-0 Vicryl, 3-0 nylon. Sterile dressings were applied. The foot was immobilized in a short-leg splint. Patient tolerated procedure well was x-rayed and transferred to the PACU in stable condition.  POSTOPERATIVE PLAN: [name] will remain nonweightbearing on this leg for approximately 6 weeks; [name] will return for suture removal in 2 weeks.  He will be immobilized in a short leg splint and then transitioned to a CAM walker at his first follow up appointment.  [name] will receive DVT prophylaxis based on other medications, activity level, and risk ratio of bleeding to thrombosis.  Mayra ReelN. Michael Densil Ottey, MD [facility] [phone] 11:01 AM

## 2015-10-18 ENCOUNTER — Encounter (HOSPITAL_BASED_OUTPATIENT_CLINIC_OR_DEPARTMENT_OTHER): Payer: Self-pay | Admitting: Orthopaedic Surgery
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
## 2017-03-29 IMAGING — RF DG C-ARM 61-120 MIN
1 series · 4 of 4 positions shown · non-contrast
Comparison: Comparison made to prior CT 10/13/2015.

CLINICAL DATA: Open reduction internal fixation right ankle.

EXAM:
DG C-ARM 61-120 MIN; RIGHT ANKLE - COMPLETE 3+ VIEW

[Series 1: run · 4 of 4 slices shown]
[im 1/4]
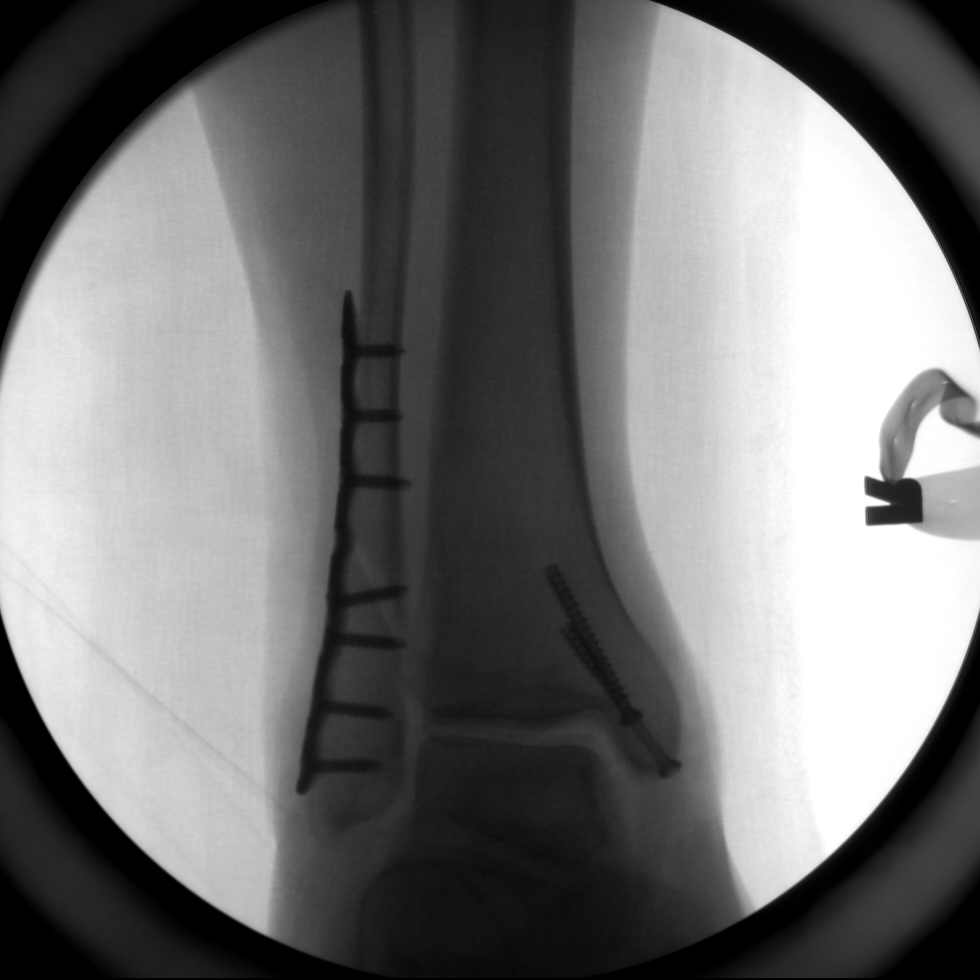
[im 2/4]
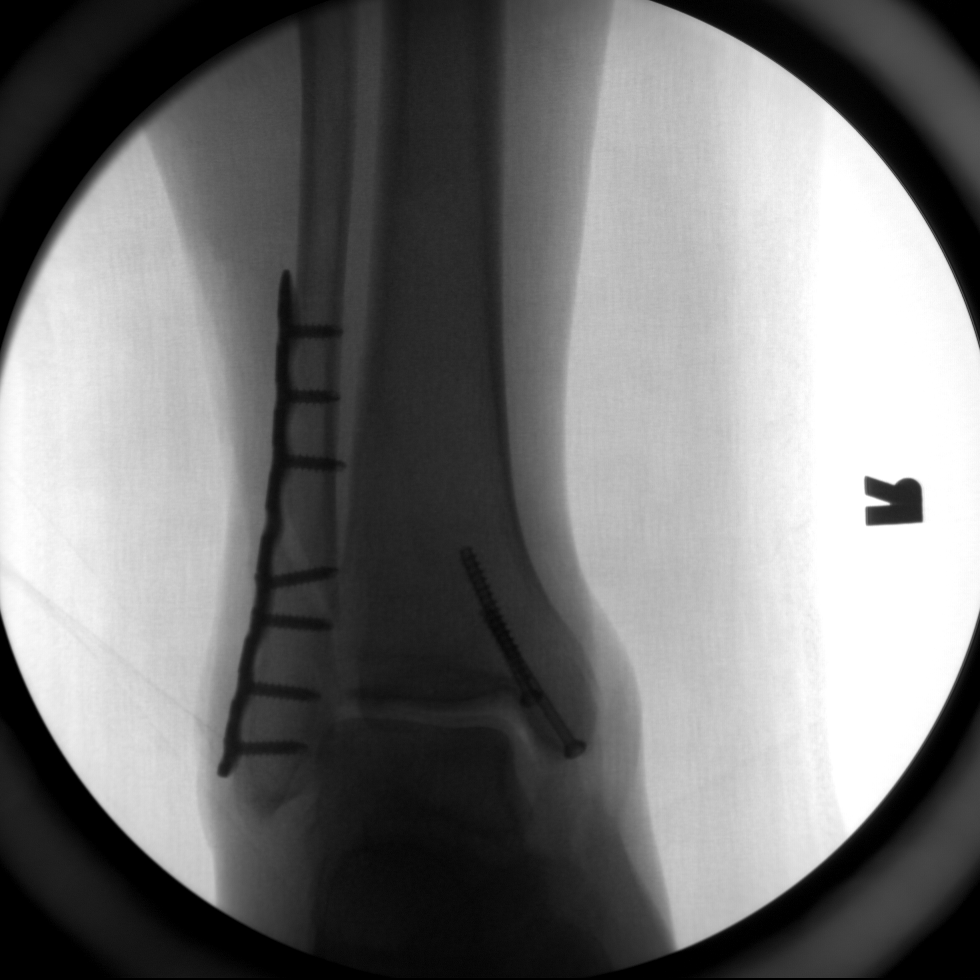
[im 3/4]
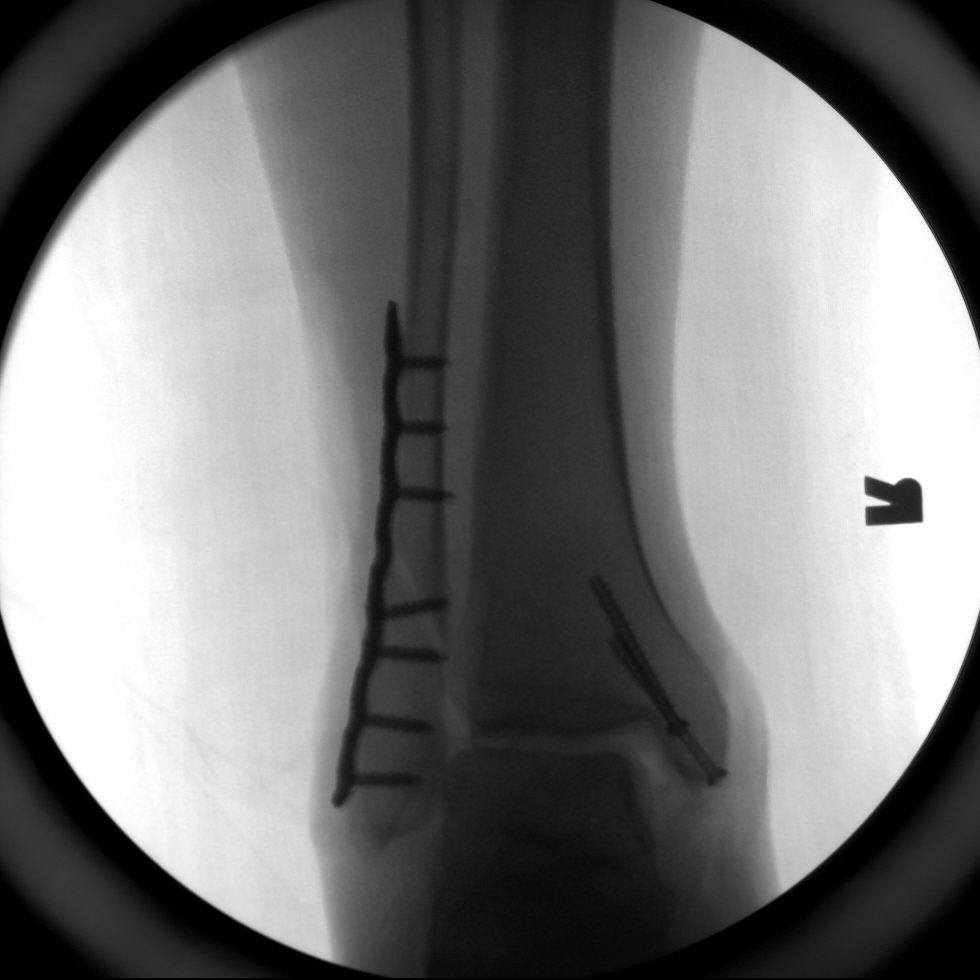
[im 4/4]
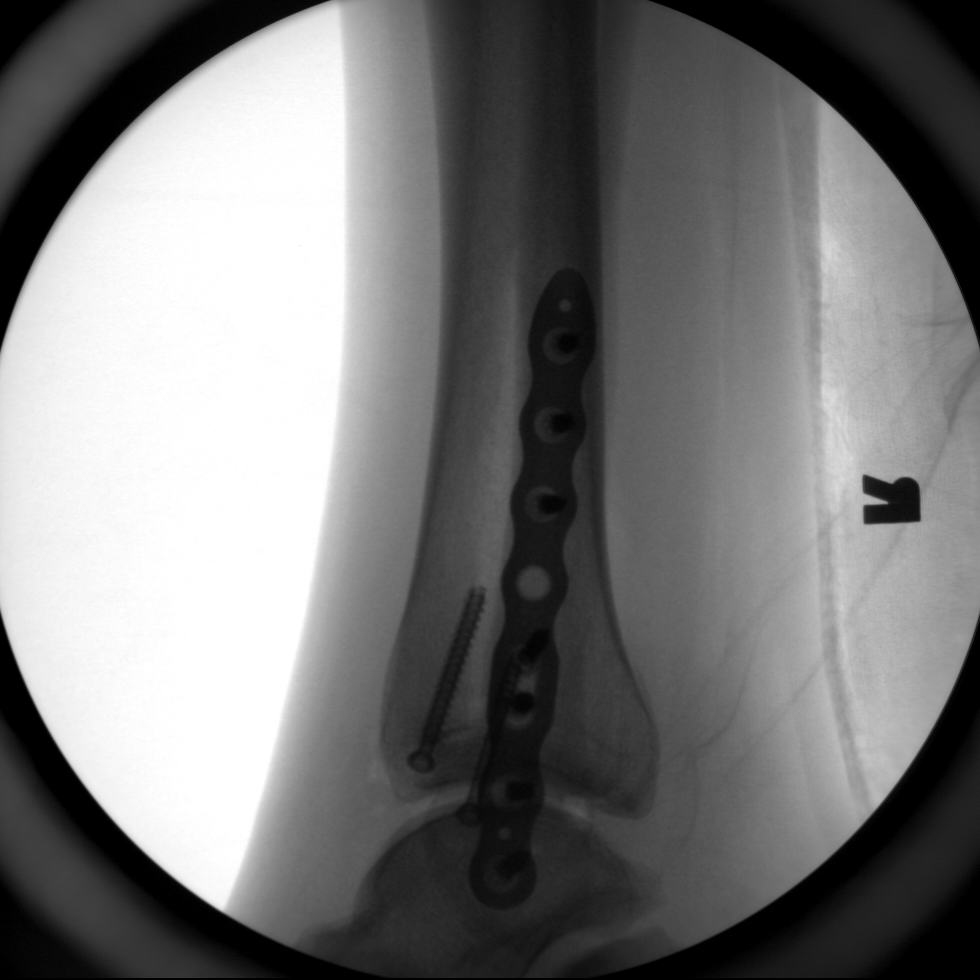

[4 of 4 positions shown; findings below may reference images not displayed]

FINDINGS: Open reduction internal fixation of bimalleolar fractures noted.
Good anatomic alignment. Hardware intact. Four images obtained. 42
seconds fluoroscopy time.
IMPRESSION: ORIF right ankle.  Good anatomic alignment.  Hardware intact .
